# Patient Record
Sex: Female | Born: 1960 | Race: White | Hispanic: No | Smoking: Former smoker
Health system: Southern US, Community
[De-identification: ages and names within clinical notes are randomized; demographics above are authoritative.]

## PROBLEM LIST (undated history)

## (undated) DIAGNOSIS — M51369 Other intervertebral disc degeneration, lumbar region without mention of lumbar back pain or lower extremity pain: Secondary | ICD-10-CM

## (undated) DIAGNOSIS — E785 Hyperlipidemia, unspecified: Secondary | ICD-10-CM

## (undated) DIAGNOSIS — F329 Major depressive disorder, single episode, unspecified: Secondary | ICD-10-CM

## (undated) DIAGNOSIS — R011 Cardiac murmur, unspecified: Secondary | ICD-10-CM

## (undated) DIAGNOSIS — F32A Depression, unspecified: Secondary | ICD-10-CM

## (undated) DIAGNOSIS — M5136 Other intervertebral disc degeneration, lumbar region: Secondary | ICD-10-CM

## (undated) DIAGNOSIS — J4530 Mild persistent asthma, uncomplicated: Secondary | ICD-10-CM

## (undated) DIAGNOSIS — F909 Attention-deficit hyperactivity disorder, unspecified type: Secondary | ICD-10-CM

## (undated) DIAGNOSIS — E118 Type 2 diabetes mellitus with unspecified complications: Secondary | ICD-10-CM

## (undated) DIAGNOSIS — K219 Gastro-esophageal reflux disease without esophagitis: Secondary | ICD-10-CM

## (undated) DIAGNOSIS — K589 Irritable bowel syndrome without diarrhea: Secondary | ICD-10-CM

## (undated) HISTORY — DX: Cardiac murmur, unspecified: R01.1

## (undated) HISTORY — DX: Hyperlipidemia, unspecified: E78.5

## (undated) HISTORY — PX: BLADDER SURGERY: SHX569

## (undated) HISTORY — DX: Depression, unspecified: F32.A

## (undated) HISTORY — PX: CATARACT EXTRACTION, BILATERAL: SHX1313

## (undated) HISTORY — DX: Irritable bowel syndrome, unspecified: K58.9

## (undated) HISTORY — DX: Mild persistent asthma, uncomplicated: J45.30

## (undated) HISTORY — DX: Attention-deficit hyperactivity disorder, unspecified type: F90.9

## (undated) HISTORY — PX: CHOLECYSTECTOMY, LAPAROSCOPIC: SHX56

## (undated) HISTORY — PX: PARTIAL HYSTERECTOMY: SHX80

## (undated) HISTORY — DX: Type 2 diabetes mellitus with unspecified complications: E11.8

## (undated) HISTORY — DX: Other intervertebral disc degeneration, lumbar region without mention of lumbar back pain or lower extremity pain: M51.369

## (undated) HISTORY — DX: Gastro-esophageal reflux disease without esophagitis: K21.9

## (undated) HISTORY — PX: NECK SURGERY: SHX720

## (undated) HISTORY — PX: TONSILLECTOMY: SHX5217

## (undated) HISTORY — DX: Other intervertebral disc degeneration, lumbar region: M51.36

---
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
## 2006-08-22 DIAGNOSIS — J45909 Unspecified asthma, uncomplicated: Secondary | ICD-10-CM | POA: Insufficient documentation

## 2006-08-22 HISTORY — DX: Unspecified asthma, uncomplicated: J45.909

## 2008-01-02 DIAGNOSIS — J301 Allergic rhinitis due to pollen: Secondary | ICD-10-CM | POA: Insufficient documentation

## 2008-01-02 HISTORY — DX: Allergic rhinitis due to pollen: J30.1

## 2010-01-12 DIAGNOSIS — G4700 Insomnia, unspecified: Secondary | ICD-10-CM

## 2010-01-12 HISTORY — DX: Insomnia, unspecified: G47.00

## 2010-11-04 DIAGNOSIS — F4321 Adjustment disorder with depressed mood: Secondary | ICD-10-CM

## 2010-11-04 HISTORY — DX: Adjustment disorder with depressed mood: F43.21

## 2012-06-11 DIAGNOSIS — E66813 Obesity, class 3: Secondary | ICD-10-CM

## 2012-06-11 HISTORY — DX: Morbid (severe) obesity due to excess calories: E66.01

## 2012-06-11 HISTORY — DX: Obesity, class 3: E66.813

## 2012-07-10 DIAGNOSIS — K911 Postgastric surgery syndromes: Secondary | ICD-10-CM

## 2012-07-10 HISTORY — DX: Postgastric surgery syndromes: K91.1

## 2013-07-03 DIAGNOSIS — R7303 Prediabetes: Secondary | ICD-10-CM

## 2013-07-03 HISTORY — DX: Prediabetes: R73.03

## 2019-08-13 DIAGNOSIS — Z01818 Encounter for other preprocedural examination: Secondary | ICD-10-CM

## 2019-12-27 DIAGNOSIS — I350 Nonrheumatic aortic (valve) stenosis: Secondary | ICD-10-CM

## 2020-01-14 ENCOUNTER — Ambulatory Visit (INDEPENDENT_AMBULATORY_CARE_PROVIDER_SITE_OTHER): Payer: Managed Care, Other (non HMO) | Admitting: Cardiology

## 2020-01-14 ENCOUNTER — Encounter: Payer: Self-pay | Admitting: Cardiology

## 2020-01-14 ENCOUNTER — Other Ambulatory Visit: Payer: Self-pay

## 2020-01-14 VITALS — BP 110/80 | HR 82 | Ht 62.0 in | Wt 199.0 lb

## 2020-01-14 DIAGNOSIS — R600 Localized edema: Secondary | ICD-10-CM

## 2020-01-14 DIAGNOSIS — E118 Type 2 diabetes mellitus with unspecified complications: Secondary | ICD-10-CM

## 2020-01-14 DIAGNOSIS — Z8249 Family history of ischemic heart disease and other diseases of the circulatory system: Secondary | ICD-10-CM

## 2020-01-14 DIAGNOSIS — R0600 Dyspnea, unspecified: Secondary | ICD-10-CM

## 2020-01-14 DIAGNOSIS — F32A Depression, unspecified: Secondary | ICD-10-CM | POA: Insufficient documentation

## 2020-01-14 DIAGNOSIS — I10 Essential (primary) hypertension: Secondary | ICD-10-CM | POA: Insufficient documentation

## 2020-01-14 DIAGNOSIS — K219 Gastro-esophageal reflux disease without esophagitis: Secondary | ICD-10-CM | POA: Insufficient documentation

## 2020-01-14 DIAGNOSIS — F329 Major depressive disorder, single episode, unspecified: Secondary | ICD-10-CM | POA: Insufficient documentation

## 2020-01-14 DIAGNOSIS — E785 Hyperlipidemia, unspecified: Secondary | ICD-10-CM

## 2020-01-14 DIAGNOSIS — K589 Irritable bowel syndrome without diarrhea: Secondary | ICD-10-CM | POA: Insufficient documentation

## 2020-01-14 DIAGNOSIS — J4530 Mild persistent asthma, uncomplicated: Secondary | ICD-10-CM | POA: Insufficient documentation

## 2020-01-14 DIAGNOSIS — R0609 Other forms of dyspnea: Secondary | ICD-10-CM | POA: Insufficient documentation

## 2020-01-14 DIAGNOSIS — F909 Attention-deficit hyperactivity disorder, unspecified type: Secondary | ICD-10-CM | POA: Insufficient documentation

## 2020-01-14 DIAGNOSIS — G43909 Migraine, unspecified, not intractable, without status migrainosus: Secondary | ICD-10-CM

## 2020-01-14 DIAGNOSIS — E669 Obesity, unspecified: Secondary | ICD-10-CM

## 2020-01-14 DIAGNOSIS — R011 Cardiac murmur, unspecified: Secondary | ICD-10-CM | POA: Insufficient documentation

## 2020-01-14 DIAGNOSIS — M5136 Other intervertebral disc degeneration, lumbar region: Secondary | ICD-10-CM | POA: Insufficient documentation

## 2020-01-14 HISTORY — DX: Migraine, unspecified, not intractable, without status migrainosus: G43.909

## 2020-01-14 HISTORY — DX: Essential (primary) hypertension: I10

## 2020-01-14 MED ORDER — POTASSIUM CHLORIDE CRYS ER 20 MEQ PO TBCR: 20.0000 meq | EXTENDED_RELEASE_TABLET | Freq: Every day | ORAL | 2 refills | Status: AC

## 2020-01-14 MED ORDER — FUROSEMIDE 40 MG PO TABS: 40.0000 mg | ORAL_TABLET | Freq: Every day | ORAL | 2 refills | Status: AC

## 2020-01-14 NOTE — Patient Instructions (Signed)
Medication Instructions:  Your physician has recommended you make the following change in your medication:  INCREASE: Lasix 40 mg daily   START: Potassium 20 meq daily   *If you need a refill on your cardiac medications before your next appointment, please call your pharmacy*   Lab Work: Your physician recommends that you return for lab work within 3-7 days of scheduled ct: bmp    If you have labs (blood work) drawn today and your tests are completely normal, you will receive your results only by: Marland Kitchen MyChart Message (if you have MyChart) OR . A paper copy in the mail If you have any lab test that is abnormal or we need to change your treatment, we will call you to review the results.   Testing/Procedures: Your cardiac CT will be scheduled at one of the below locations:   [facility] [address] [place], [place] [address] [phone]  OR  [facility] [address] [place], [place] [address] [phone]  If scheduled at [facility], please arrive at the [facility] main entrance of [facility] 30 minutes prior to test start time. Proceed to the [facility] Radiology Department (first floor) to check-in and test prep.  If scheduled at [facility], please arrive 15 mins early for check-in and test prep.  Please follow these instructions carefully (unless otherwise directed):    On the Night Before the Test: . Be sure to Drink plenty of water. . Do not consume any caffeinated/decaffeinated beverages or chocolate 12 hours prior to your test. . Do not take any antihistamines 12 hours prior to your test. . If you take Metformin do not take 24 hours prior to test.   On the Day of the Test: . Drink plenty of water. Do not drink any water within one hour of the test. . Do not eat any food 4 hours prior to the test. . You may take your regular medications prior to the  test.  . Take metoprolol (Lopressor) two hours prior to test. . HOLD Furosemide and Hydrochlorothiazide morning of the test. . FEMALES- please wear underwire-free bra if available   *For Clinical Staff only. Please instruct patient the following:*        -Drink plenty of water       -Hold Furosemide and hydrochlorothiazide morning of the test       -Take metoprolol (Lopressor) 2 hours prior to test (if applicable).                        After the Test: . Drink plenty of water. . After receiving IV contrast, you may experience a mild flushed feeling. This is normal. . On occasion, you may experience a mild rash up to 24 hours after the test. This is not dangerous. If this occurs, you can take Benadryl 25 mg and increase your fluid intake. . If you experience trouble breathing, this can be serious. If it is severe call 911 IMMEDIATELY. If it is mild, please call our office. . If you take any of these medications: Glipizide/Metformin, Avandament, Glucavance, please do not take 48 hours after completing test unless otherwise instructed.   Once we have confirmed authorization from your insurance company, we will call you to set up a date and time for your test.   For non-scheduling related questions, please contact the cardiac imaging nurse navigator should you have any questions/concerns: Huntley Dec  Earlene Plater, RN Navigator Cardiac Imaging [facility] [phone] office  For scheduling needs, including cancellations and rescheduling, please call [phone].      Follow-Up: At [facility], you and your health needs are our priority.  As part of our continuing mission to provide you with exceptional heart care, we have created designated Provider Care Teams.  These Care Teams include your primary Cardiologist (physician) and Advanced Practice Providers (APPs -  Physician Assistants and Nurse Practitioners) who all work together to provide you with the care you  need, when you need it.  We recommend signing up for the patient portal called "MyChart".  Sign up information is provided on this After Visit Summary.  MyChart is used to connect with patients for Virtual Visits (Telemedicine).  Patients are able to view lab/test results, encounter notes, upcoming appointments, etc.  Non-urgent messages can be sent to your provider as well.   To learn more about what you can do with MyChart, go to [email or web address].    Your next appointment:   1 month(s)  The format for your next appointment:   In Person  Provider:   Thomasene Ripple, DO   Other Instructions   Cardiac CT Angiogram A cardiac CT angiogram is a procedure to look at the heart and the area around the heart. It may be done to help find the cause of chest pains or other symptoms of heart disease. During this procedure, a substance called contrast dye is injected into the blood vessels in the area to be checked. A large X-ray machine, called a CT scanner, then takes detailed pictures of the heart and the surrounding area. The procedure is also sometimes called a coronary CT angiogram, coronary artery scanning, or CTA. A cardiac CT angiogram allows the health care provider to see how well blood is flowing to and from the heart. The health care provider will be able to see if there are any problems, such as:  Blockage or narrowing of the coronary arteries in the heart.  Fluid around the heart.  Signs of weakness or disease in the muscles, valves, and tissues of the heart. Tell a health care provider about:  Any allergies you have. This is especially important if you have had a previous allergic reaction to contrast dye.  All medicines you are taking, including vitamins, herbs, eye drops, creams, and over-the-counter medicines.  Any blood disorders you have.  Any surgeries you have had.  Any medical conditions you have.  Whether you are pregnant or may be pregnant.  Any anxiety  disorders, chronic pain, or other conditions you have that may increase your stress or prevent you from lying still. What are the risks? Generally, this is a safe procedure. However, problems may occur, including:  Bleeding.  Infection.  Allergic reactions to medicines or dyes.  Damage to other structures or organs.  Kidney damage from the contrast dye that is used.  Increased risk of cancer from radiation exposure. This risk is low. Talk with your health care provider about: ? The risks and benefits of testing. ? How you can receive the lowest dose of radiation. What happens before the procedure?  Wear comfortable clothing and remove any jewelry, glasses, dentures, and hearing aids.  Follow instructions from your health care provider about eating and drinking. This may include: ? For 12 hours before the procedure -- avoid caffeine. This includes tea, coffee, soda, energy drinks, and diet pills. Drink plenty of water or other fluids that do  not have caffeine in them. Being well hydrated can prevent complications. ? For 4-6 hours before the procedure -- stop eating and drinking. The contrast dye can cause nausea, but this is less likely if your stomach is empty.  Ask your health care provider about changing or stopping your regular medicines. This is especially important if you are taking diabetes medicines, blood thinners, or medicines to treat problems with erections (erectile dysfunction). What happens during the procedure?   Hair on your chest may need to be removed so that small sticky patches called electrodes can be placed on your chest. These will transmit information that helps to monitor your heart during the procedure.  An IV will be inserted into one of your veins.  You might be given a medicine to control your heart rate during the procedure. This will help to ensure that good images are obtained.  You will be asked to lie on an exam table. This table will slide in and  out of the CT machine during the procedure.  Contrast dye will be injected into the IV. You might feel warm, or you may get a metallic taste in your mouth.  You will be given a medicine called nitroglycerin. This will relax or dilate the arteries in your heart.  The table that you are lying on will move into the CT machine tunnel for the scan.  The person running the machine will give you instructions while the scans are being done. You may be asked to: ? Keep your arms above your head. ? Hold your breath. ? Stay very still, even if the table is moving.  When the scanning is complete, you will be moved out of the machine.  The IV will be removed. The procedure may vary among health care providers and hospitals. What can I expect after the procedure? After your procedure, it is common to have:  A metallic taste in your mouth from the contrast dye.  A feeling of warmth.  A headache from the nitroglycerin. Follow these instructions at home:  Take over-the-counter and prescription medicines only as told by your health care provider.  If you are told, drink enough fluid to keep your urine pale yellow. This will help to flush the contrast dye out of your body.  Most people can return to their normal activities right after the procedure. Ask your health care provider what activities are safe for you.  It is up to you to get the results of your procedure. Ask your health care provider, or the department that is doing the procedure, when your results will be ready.  Keep all follow-up visits as told by your health care provider. This is important. Contact a health care provider if:  You have any symptoms of allergy to the contrast dye. These include: ? Shortness of breath. ? Rash or hives. ? A racing heartbeat. Summary  A cardiac CT angiogram is a procedure to look at the heart and the area around the heart. It may be done to help find the cause of chest pains or other symptoms of  heart disease.  During this procedure, a large X-ray machine, called a CT scanner, takes detailed pictures of the heart and the surrounding area after a contrast dye has been injected into blood vessels in the area.  Ask your health care provider about changing or stopping your regular medicines before the procedure. This is especially important if you are taking diabetes medicines, blood thinners, or medicines to treat erectile  dysfunction.  If you are told, drink enough fluid to keep your urine pale yellow. This will help to flush the contrast dye out of your body. This information is not intended to replace advice given to you by your health care provider. Make sure you discuss any questions you have with your health care provider. Document Revised: 05/01/2019 Document Reviewed: 05/01/2019 Elsevier Patient Education  [place].

## 2020-01-14 NOTE — Progress Notes (Signed)
Cardiology Office Note:    Date:  01/14/2020   ID:  Shirley Nash, DOB 07-Dec-1960, MRN [id number]  PCP:  Lowella Dandy, NP  Cardiologist:  No primary care provider on file.  Electrophysiologist:  None   Referring MD: Lowella Dandy, NP   Chief Complaint  Patient presents with  . New Patient (Initial Visit)    History of Present Illness:    Shirley Nash is a 59 y.o. female with a hx of hypertension, hyperlipidemia, family history of premature coronary artery disease presents to be evaluated for shortness of breath.  The patient tells me that she did see her primary care doctor after her surgery back in January due to significant hypertension.  At that time to start amlodipine.  Shortly after starting amlodipine she experienced increased leg edema.  This medication was stopped she was put on diuretics.  Unfortunately her leg edema continued with high resolution.  She was sent for a transthoracic echocardiogram at [facility] which show mild aortic stenosis with diastolic dysfunction.  She tells me that she also is experiencing progressive shortness of breath on exertion.  She notes that there is nothing she can do without experiencing significant shortness of breath.  Notes that even climbing on the clinic bed short of breath.  She denies any chest pain.  He is concerned given her family history of coronary artery disease.  Past Medical History:  Diagnosis Date  . Adjustment disorder with depressed mood 11/04/2010  . Adult ADHD (attention deficit hyperactivity disorder)   . Allergic rhinitis due to pollen 01/02/2008  . Asthma 08/22/2006  . Degeneration, intervertebral disc, lumbar   . Depression   . 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 Dyslipidemia   . Gastroesophageal reflux disease   . Heart murmur   . Hypertension 01/14/2020  . Insomnia 01/12/2010  . Irritable bowel syndrome   . Migraine 01/14/2020  . Mild persistent asthma   . Obesity, Class III, BMI 40-49.9 (morbid obesity) ([place])  06/11/2012  . Prediabetes 07/03/2013  . Type II diabetes mellitus with complication [facility])     Past Surgical History:  Procedure Laterality Date  . BLADDER SURGERY    . CATARACT EXTRACTION, BILATERAL Bilateral   . CHOLECYSTECTOMY, LAPAROSCOPIC    . NECK SURGERY    . PARTIAL HYSTERECTOMY    . TONSILLECTOMY      Current Medications: Current Meds  Medication Sig  . amphetamine-dextroamphetamine (ADDERALL XR) 30 MG 24 hr capsule Take by mouth.  . Aspirin Buf,CaCarb-MgCarb-MgO, 81 MG TABS Take by mouth.  Marland Kitchen atorvastatin (LIPITOR) 20 MG tablet Take 20 mg by mouth daily.  . Azelastine-Fluticasone (DYMISTA) 137-50 MCG/ACT SUSP Inhale into the lungs.  . benazepril (LOTENSIN) 40 MG tablet Take 40 mg by mouth daily.  . Biotin 1000 MCG tablet Take by mouth.  Marland Kitchen buPROPion (WELLBUTRIN XL) 150 MG 24 hr tablet Take by mouth.  . cyclobenzaprine (FLEXERIL) 5 MG tablet Take by mouth.  . ergocalciferol (VITAMIN D2) 1.25 MG (50000 UT) capsule Take by mouth.  . Fluticasone-Salmeterol (ADVAIR) 100-50 MCG/DOSE AEPB Inhale 1 puff into the lungs 2 (two) times daily.  . furosemide (LASIX) 20 MG tablet Take by mouth.  . hydrochlorothiazide (HYDRODIURIL) 12.5 MG tablet Take by mouth.  . meclizine (ANTIVERT) 12.5 MG tablet Take 12.5 mg by mouth 3 (three) times daily as needed.  . metFORMIN (GLUCOPHAGE) 1000 MG tablet Take 1,000 mg by mouth daily.  . metoprolol succinate (TOPROL-XL) 50 MG 24 hr tablet Take 50 mg by mouth daily.  Marland Kitchen  montelukast (SINGULAIR) 10 MG tablet Take 10 mg by mouth daily.     Allergies:   Patient has no allergy information on record.   Social History   Socioeconomic History  . Marital status: Unknown    Spouse name: Not on file  . Number of children: Not on file  . Years of education: Not on file  . Highest education level: Not on file  Occupational History  . Not on file  Tobacco Use  . Smoking status: Former Research scientist (life sciences)  . Smokeless tobacco: Never Used  Substance and Sexual  Activity  . Alcohol use: Yes  . Drug use: Never  . Sexual activity: Not on file  Other Topics Concern  . Not on file  Social History Narrative  . Not on file   Social Determinants of Health   Financial Resource Strain:   . Difficulty of Paying Living Expenses:   Food Insecurity:   . Worried About Charity fundraiser in the Last Year:   . Arboriculturist in the Last Year:   Transportation Needs:   . Film/video editor (Medical):   Marland Kitchen Lack of Transportation (Non-Medical):   Physical Activity:   . Days of Exercise per Week:   . Minutes of Exercise per Session:   Stress:   . Feeling of Stress :   Social Connections:   . Frequency of Communication with Friends and Family:   . Frequency of Social Gatherings with Friends and Family:   . Attends Religious Services:   . Active Member of Clubs or Organizations:   . Attends Archivist Meetings:   Marland Kitchen Marital Status:      Family History: The patient's family history includes Asthma in her mother and sister; Diabetes in her father and paternal grandmother; Heart disease in her father; Hypertension in her father, maternal grandmother, paternal grandmother, and sister; Prostate cancer in her father; Skin cancer in her father; Stroke in her maternal grandmother and mother.  ROS:   Review of Systems  Constitution: Negative for decreased appetite, fever and weight gain.  HENT: Negative for congestion, ear discharge, hoarse voice and sore throat.   Eyes: Negative for discharge, redness, vision loss in right eye and visual halos.  Cardiovascular: Reports dyspnea on exertion bilateral leg edema.  Negative for chest pain, orthopnea and palpitations.  Respiratory: Negative for cough, hemoptysis, shortness of breath and snoring.   Endocrine: Negative for heat intolerance and polyphagia.  Hematologic/Lymphatic: Negative for bleeding problem. Does not bruise/bleed easily.  Skin: Negative for flushing, nail changes, rash and suspicious  lesions.  Musculoskeletal: Negative for arthritis, joint pain, muscle cramps, myalgias, neck pain and stiffness.  Gastrointestinal: Negative for abdominal pain, bowel incontinence, diarrhea and excessive appetite.  Genitourinary: Negative for decreased libido, genital sores and incomplete emptying.  Neurological: Negative for brief paralysis, focal weakness, headaches and loss of balance.  Psychiatric/Behavioral: Negative for altered mental status, depression and suicidal ideas.  Allergic/Immunologic: Negative for HIV exposure and persistent infections.    EKGs/Labs/Other Studies Reviewed:    The following studies were reviewed today:   EKG:  The ekg ordered today demonstrates sinus rhythm, heart rate 82 bpm with electrical conduction suggesting left atrial enlargement.  No prior EKG for comparison.  Transthoracic echocardiogram done on December 27, 2019 at [facility] shows normal LVEF 55 to 60%.  Diastolic filling pattern suggest impaired relaxation.  No wall motion abnormalities.  Right ventricle normal in size.  Left atrium is mildly dilated.  Mild aortic  stenosis: Mean gradient 12 mmHg, valve area by continuity equation 1.72 cm.  Recent Labs: Chemistry: Linton Rump 122, BUN 20, creatinine 0.91, sodium 140, potassium 3.3, chloride 97, bicarb 29, calcium 10.1, total protein 7.2, albumin 4.5, total globulin 2.7, total bili 0.6, alk phos 106, AST 24, ALT 31 Recent Lipid Panel No results found for: CHOL, TRIG, HDL, CHOLHDL, VLDL, LDLCALC, LDLDIRECT  Physical Exam:    VS:  BP 110/80   Pulse 82   Ht 5' 2"  (1.575 m)   Wt 199 lb (90.3 kg)   SpO2 100%   BMI 36.40 kg/m     Wt Readings from Last 3 Encounters:  01/14/20 199 lb (90.3 kg)     GEN: Well nourished, well developed in no acute distress HEENT: Normal NECK: No JVD; No carotid bruits LYMPHATICS: No lymphadenopathy CARDIAC: S1S2 noted,RRR, no murmurs, rubs, gallops RESPIRATORY:  Clear to auscultation without rales,  wheezing or rhonchi  ABDOMEN: Soft, non-tender, non-distended, +bowel sounds, no guarding. EXTREMITIES: No edema, No cyanosis, no clubbing MUSCULOSKELETAL:  No deformity  SKIN: Warm and dry NEUROLOGIC:  Alert and oriented x 3, non-focal PSYCHIATRIC:  Normal affect, good insight  ASSESSMENT:    1. Dyspnea on exertion   2. Type II diabetes mellitus with complication (HCC)   3. Dyslipidemia   4. Bilateral leg edema   5. Family history of premature CAD   6. Obesity (BMI 30-39.9)    PLAN:     1.  Her dyspnea on exertion is concerning for angina equivalent.  Therefore I am going to pursue ischemic evaluation in this patient given her significant risk factors which includes diabetes, premature family history of coronary artery disease, dyslipidemia and hypertension.  I have educated patient about this testing she has no IV contrast allergy she is willing to proceed with this testing.  2.  Bilateral leg edema there is evidence of +1 bilateral lower extremity edema.  I do think that venous insufficiency may be playing a role here.  But for now I am going to increase her Lasix to 40 mg daily and add potassium supplement of 20 mEq daily.  But the educated patient she is willing to do this.  I also asked her to use support stockings daily.  3.  Type 2 diabetes-continue her medication per her PCP.  4.  Hyperlipidemia to the patient on her atorvastatin 20 mg dosing.  5.  Hypertension-continue her current medication regimen to keep her target blood pressure less than 130/80.  6.  Obesity-the patient understands the need to lose weight with diet and exercise. We have discussed specific strategies for this.  7.  I reviewed her blood work from her PCP which was done on January 10, 2020 there is evidence of hypokalemia but this cannot be repleted with 20 mEq daily of potassium.  8.  Also reviewed her echocardiogram which was done at [facility] and also discussed with the patient.  Her questions  were answered.   The patient is in agreement with the above plan. The patient left the office in stable condition.  The patient will follow up in 1 month or sooner if needed due to medication increase   Medication Adjustments/Labs and Tests Ordered: Current medicines are reviewed at length with the patient today.  Concerns regarding medicines are outlined above.  No orders of the defined types were placed in this encounter.  No orders of the defined types were placed in this encounter.   There are no Patient Instructions on file for  this visit.   Adopting a Healthy Lifestyle.  Know what a healthy weight is for you (roughly BMI <25) and aim to maintain this   Aim for 7+ servings of fruits and vegetables daily   65-80+ fluid ounces of water or unsweet tea for healthy kidneys   Limit to max 1 drink of alcohol per day; avoid smoking/tobacco   Limit animal fats in diet for cholesterol and heart health - choose grass fed whenever available   Avoid highly processed foods, and foods high in saturated/trans fats   Aim for low stress - take time to unwind and care for your mental health   Aim for 150 min of moderate intensity exercise weekly for heart health, and weights twice weekly for bone health   Aim for 7-9 hours of sleep daily   When it comes to diets, agreement about the perfect plan isnt easy to find, even among the experts. Experts at the [place] developed an idea known as the Healthy Eating Plate. Just imagine a plate divided into logical, healthy portions.   The emphasis is on diet quality:   Load up on vegetables and fruits - one-half of your plate: Aim for color and variety, and remember that potatoes dont count.   Go for whole grains - one-quarter of your plate: Whole wheat, barley, wheat berries, quinoa, oats, brown rice, and foods made with them. If you want pasta, go with whole wheat pasta.   Protein power - one-quarter of your plate:  Fish, chicken, beans, and nuts are all healthy, versatile protein sources. Limit red meat.   The diet, however, does go beyond the plate, offering a few other suggestions.   Use healthy plant oils, such as olive, canola, soy, corn, sunflower and peanut. Check the labels, and avoid partially hydrogenated oil, which have unhealthy trans fats.   If youre thirsty, drink water. Coffee and tea are good in moderation, but skip sugary drinks and limit milk and dairy products to one or two daily servings.   The type of carbohydrate in the diet is more important than the amount. Some sources of carbohydrates, such as vegetables, fruits, whole grains, and beans-are healthier than others.   Finally, stay active  Signed, Berniece Salines, DO  01/14/2020 3:19 PM    [facility]

## 2020-01-19 DIAGNOSIS — E785 Hyperlipidemia, unspecified: Secondary | ICD-10-CM | POA: Diagnosis not present

## 2020-01-19 DIAGNOSIS — E1169 Type 2 diabetes mellitus with other specified complication: Secondary | ICD-10-CM | POA: Diagnosis not present

## 2020-01-19 DIAGNOSIS — R079 Chest pain, unspecified: Secondary | ICD-10-CM | POA: Diagnosis not present

## 2020-01-19 DIAGNOSIS — I10 Essential (primary) hypertension: Secondary | ICD-10-CM | POA: Diagnosis not present

## 2020-01-19 DIAGNOSIS — R0602 Shortness of breath: Secondary | ICD-10-CM | POA: Diagnosis not present

## 2020-01-20 DIAGNOSIS — I10 Essential (primary) hypertension: Secondary | ICD-10-CM | POA: Diagnosis not present

## 2020-01-20 DIAGNOSIS — R0602 Shortness of breath: Secondary | ICD-10-CM | POA: Diagnosis not present

## 2020-01-20 DIAGNOSIS — E1169 Type 2 diabetes mellitus with other specified complication: Secondary | ICD-10-CM | POA: Diagnosis not present

## 2020-01-20 DIAGNOSIS — E785 Hyperlipidemia, unspecified: Secondary | ICD-10-CM | POA: Diagnosis not present

## 2020-01-20 DIAGNOSIS — R079 Chest pain, unspecified: Secondary | ICD-10-CM | POA: Diagnosis not present

## 2020-01-31 LAB — BASIC METABOLIC PANEL
BUN/Creatinine Ratio: 19 (ref 9–23)
BUN: 18 mg/dL (ref 6–24)
CO2: 26 mmol/L (ref 20–29)
Calcium: 10.6 mg/dL — ABNORMAL HIGH (ref 8.7–10.2)
Chloride: 97 mmol/L (ref 96–106)
Creatinine, Ser: 0.97 mg/dL (ref 0.57–1.00)
GFR calc Af Amer: 76 mL/min/{1.73_m2} (ref >59)
GFR calc non Af Amer: 65 mL/min/{1.73_m2} (ref >59)
Glucose: 62 mg/dL — ABNORMAL LOW (ref 65–99)
Potassium: 3.9 mmol/L (ref 3.5–5.2)
Sodium: 138 mmol/L (ref 134–144)

## 2020-02-03 ENCOUNTER — Telehealth: Payer: Self-pay

## 2020-02-03 ENCOUNTER — Telehealth (HOSPITAL_COMMUNITY): Payer: Self-pay | Admitting: *Deleted

## 2020-02-03 NOTE — Telephone Encounter (Signed)
Spoke with patient regarding results and recommendation.  Patient verbalizes understanding and is agreeable to plan of care. Advised patient to call back with any issues or concerns.  

## 2020-02-03 NOTE — Telephone Encounter (Signed)
-----   Message from Thomasene Ripple, DO sent at 01/31/2020  6:25 PM EDT ----- Blood glucose on the lower side. Her calcium is 10.6 likely dehydration

## 2020-02-03 NOTE — Telephone Encounter (Signed)

## 2020-02-04 ENCOUNTER — Ambulatory Visit (HOSPITAL_COMMUNITY)
Admission: RE | Admit: 2020-02-04 | Discharge: 2020-02-04 | Disposition: A | Discharge status: Home / Self Care | Payer: Managed Care, Other (non HMO) | Source: Ambulatory Visit | Attending: Cardiology | Admitting: Cardiology

## 2020-02-04 ENCOUNTER — Encounter (HOSPITAL_COMMUNITY): Payer: Self-pay

## 2020-02-04 ENCOUNTER — Other Ambulatory Visit: Payer: Self-pay

## 2020-02-04 DIAGNOSIS — R0600 Dyspnea, unspecified: Secondary | ICD-10-CM | POA: Insufficient documentation

## 2020-02-04 DIAGNOSIS — E785 Hyperlipidemia, unspecified: Secondary | ICD-10-CM | POA: Insufficient documentation

## 2020-02-04 MED ORDER — IOHEXOL 350 MG/ML SOLN
80.0000 mL | Freq: Once | INTRAVENOUS | Status: AC | PRN
  Administered 2020-02-04: 80 mL via INTRAVENOUS

## 2020-02-04 MED ORDER — NITROGLYCERIN 0.4 MG SL SUBL
0.8000 mg | SUBLINGUAL_TABLET | Freq: Once | SUBLINGUAL | Status: AC
  Administered 2020-02-04: 0.8 mg via SUBLINGUAL

## 2020-02-04 MED ORDER — METOPROLOL TARTRATE 5 MG/5ML IV SOLN
5.0000 mg | Freq: Once | INTRAVENOUS | Status: AC
  Administered 2020-02-04: 5 mg via INTRAVENOUS

## 2020-02-05 ENCOUNTER — Telehealth: Payer: Self-pay

## 2020-02-05 DIAGNOSIS — E785 Hyperlipidemia, unspecified: Secondary | ICD-10-CM

## 2020-02-05 DIAGNOSIS — I10 Essential (primary) hypertension: Secondary | ICD-10-CM

## 2020-02-05 MED ORDER — ATORVASTATIN CALCIUM 40 MG PO TABS: 40.0000 mg | ORAL_TABLET | Freq: Every day | ORAL | 3 refills | Status: AC

## 2020-02-05 NOTE — Telephone Encounter (Signed)
Spoke with patient regarding results and recommendations.  Patient verbalizes understanding and is agreeable to plan of care. Advised patient to call back with any issues or concerns.   Order placed for Atorvastatin and future labs.

## 2020-02-05 NOTE — Telephone Encounter (Signed)
-----   Message from Alver Sorrow, NP sent at 02/05/2020 11:14 AM EDT ----- Cardiac CTA shows mild plaque on the heart arteries, but none causing blockages needing to be fixed. This is called 'nonobstructive coronary artery disease'. Overall good result! Plan to treat with preventative measures. Continue aspirin 81mg  daily. Recommend LDL goal of <70 (this is your "lousy" cholesterol - most recent by PCP in March of 84) - recommend increasing Atorvastatin to 40mg  daily and repeat lipid/liver in 6 weeks. She has appt 02/13/20 with Dr. and results will be discussed in depth.

## 2020-02-13 ENCOUNTER — Other Ambulatory Visit: Payer: Self-pay

## 2020-02-13 ENCOUNTER — Encounter: Payer: Self-pay | Admitting: Cardiology

## 2020-02-13 ENCOUNTER — Ambulatory Visit (INDEPENDENT_AMBULATORY_CARE_PROVIDER_SITE_OTHER): Payer: Managed Care, Other (non HMO) | Admitting: Cardiology

## 2020-02-13 VITALS — BP 124/78 | HR 87 | Ht 62.0 in | Wt 198.4 lb

## 2020-02-13 DIAGNOSIS — I10 Essential (primary) hypertension: Secondary | ICD-10-CM

## 2020-02-13 DIAGNOSIS — I2510 Atherosclerotic heart disease of native coronary artery without angina pectoris: Secondary | ICD-10-CM

## 2020-02-13 DIAGNOSIS — I350 Nonrheumatic aortic (valve) stenosis: Secondary | ICD-10-CM

## 2020-02-13 DIAGNOSIS — R0602 Shortness of breath: Secondary | ICD-10-CM | POA: Insufficient documentation

## 2020-02-13 DIAGNOSIS — E118 Type 2 diabetes mellitus with unspecified complications: Secondary | ICD-10-CM | POA: Diagnosis not present

## 2020-02-13 DIAGNOSIS — I739 Peripheral vascular disease, unspecified: Secondary | ICD-10-CM

## 2020-02-13 NOTE — Patient Instructions (Signed)
Medication Instructions:  Your physician recommends that you continue on your current medications as directed. Please refer to the Current Medication list given to you today.  *If you need a refill on your cardiac medications before your next appointment, please call your pharmacy*   Lab Work: NONE If you have labs (blood work) drawn today and your tests are completely normal, you will receive your results only by: Marland Kitchen MyChart Message (if you have MyChart) OR . A paper copy in the mail If you have any lab test that is abnormal or we need to change your treatment, we will call you to review the results.   Testing/Procedures: Your physician has requested that you have a lower  extremity arterial duplex. This test is an ultrasound of the arteries in the legs or arms. It looks at arterial blood flow in the legs and arms. Allow one hour for Lower and Upper Arterial scans. There are no restrictions or special instructions  Your physician has requested that you have an ankle brachial index (ABI). During this test an ultrasound and blood pressure cuff are used to evaluate the arteries that supply the arms and legs with blood. Allow thirty minutes for this exam. There are no restrictions or special instructions.   Follow-Up: At [facility], you and your health needs are our priority.  As part of our continuing mission to provide you with exceptional heart care, we have created designated Provider Care Teams.  These Care Teams include your primary Cardiologist (physician) and Advanced Practice Providers (APPs -  Physician Assistants and Nurse Practitioners) who all work together to provide you with the care you need, when you need it.  We recommend signing up for the patient portal called "MyChart".  Sign up information is provided on this After Visit Summary.  MyChart is used to connect with patients for Virtual Visits (Telemedicine).  Patients are able to view lab/test results, encounter notes,  upcoming appointments, etc.  Non-urgent messages can be sent to your provider as well.   To learn more about what you can do with MyChart, go to [email or web address].    Your next appointment:   6 month(s)  The format for your next appointment:   In Person  Provider:   Thomasene Ripple, DO

## 2020-02-13 NOTE — Progress Notes (Signed)
Cardiology Office Note:    Date:  02/13/2020   ID:  Shirley Nash, DOB 12-19-1960, MRN [id number]  PCP:  Lowella Dandy, NP  Cardiologist:  Berniece Salines, DO  Electrophysiologist:  None   Referring MD: Lowella Dandy, NP   Chief Complaint  Patient presents with  . Follow-up    1 MO FU   " I am still short of breath"  History of Present Illness:    Shirley Nash is a 59 y.o. female with a hx of hypertension, hyperlipidemia, CAD presents today for a follow up visit. She is still short of breath she tells me.   Past Medical History:  Diagnosis Date  . Adjustment disorder with depressed mood 11/04/2010  . Adult ADHD (attention deficit hyperactivity disorder)   . Allergic rhinitis due to pollen 01/02/2008  . Asthma 08/22/2006  . Degeneration, intervertebral disc, lumbar   . Depression   . 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 Dyslipidemia   . Gastroesophageal reflux disease   . Heart murmur   . Hypertension 01/14/2020  . Insomnia 01/12/2010  . Irritable bowel syndrome   . Migraine 01/14/2020  . Mild persistent asthma   . Obesity, Class III, BMI 40-49.9 (morbid obesity) ([place]) 06/11/2012  . Prediabetes 07/03/2013  . Type II diabetes mellitus with complication [facility])     Past Surgical History:  Procedure Laterality Date  . BLADDER SURGERY    . CATARACT EXTRACTION, BILATERAL Bilateral   . CHOLECYSTECTOMY, LAPAROSCOPIC    . NECK SURGERY    . PARTIAL HYSTERECTOMY    . TONSILLECTOMY      Current Medications: Current Meds  Medication Sig  . amphetamine-dextroamphetamine (ADDERALL XR) 30 MG 24 hr capsule Take 60 mg by mouth daily.   . Aspirin Buf,CaCarb-MgCarb-MgO, 81 MG TABS Take by mouth daily.   Marland Kitchen atorvastatin (LIPITOR) 40 MG tablet Take 1 tablet (40 mg total) by mouth daily.  . Azelastine-Fluticasone (DYMISTA) 137-50 MCG/ACT SUSP Inhale into the lungs as needed.   . benazepril (LOTENSIN) 40 MG tablet Take 40 mg by mouth daily.  . Biotin 1000 MCG tablet Take 1,000 mcg by mouth daily.   Marland Kitchen  buPROPion (WELLBUTRIN XL) 150 MG 24 hr tablet Take 450 mg by mouth daily.   . ergocalciferol (VITAMIN D2) 1.25 MG (50000 UT) capsule Take 50,000 Units by mouth once a week.   . furosemide (LASIX) 40 MG tablet Take 1 tablet (40 mg total) by mouth daily.  . hydrochlorothiazide (HYDRODIURIL) 12.5 MG tablet Take 12.5 mg by mouth daily.   . meclizine (ANTIVERT) 12.5 MG tablet Take 12.5 mg by mouth 3 (three) times daily as needed.  . metFORMIN (GLUCOPHAGE) 1000 MG tablet Take 1,000 mg by mouth daily.  . metoprolol succinate (TOPROL-XL) 50 MG 24 hr tablet Take 50 mg by mouth daily.  . montelukast (SINGULAIR) 10 MG tablet Take 10 mg by mouth daily.  . potassium chloride SA (KLOR-CON) 20 MEQ tablet Take 1 tablet (20 mEq total) by mouth daily.     Allergies:   Patient has no known allergies.   Social History   Socioeconomic History  . Marital status: Unknown    Spouse name: Not on file  . Number of children: Not on file  . Years of education: Not on file  . Highest education level: Not on file  Occupational History  . Not on file  Tobacco Use  . Smoking status: Former Smoker    Quit date: 2011    Years since quitting: 10.4  .  Smokeless tobacco: Never Used  Substance and Sexual Activity  . Alcohol use: Yes  . Drug use: Never  . Sexual activity: Not on file  Other Topics Concern  . Not on file  Social History Narrative  . Not on file   Social Determinants of Health   Financial Resource Strain:   . Difficulty of Paying Living Expenses:   Food Insecurity:   . Worried About Programme researcher, broadcasting/film/video in the Last Year:   . Barista in the Last Year:   Transportation Needs:   . Freight forwarder (Medical):   Marland Kitchen Lack of Transportation (Non-Medical):   Physical Activity:   . Days of Exercise per Week:   . Minutes of Exercise per Session:   Stress:   . Feeling of Stress :   Social Connections:   . Frequency of Communication with Friends and Family:   . Frequency of Social  Gatherings with Friends and Family:   . Attends Religious Services:   . Active Member of Clubs or Organizations:   . Attends Banker Meetings:   Marland Kitchen Marital Status:      Family History: The patient's family history includes Asthma in her mother and sister; Diabetes in her father and paternal grandmother; Heart disease in her father; Hypertension in her father, maternal grandmother, paternal grandmother, and sister; Prostate cancer in her father; Skin cancer in her father; Stroke in her maternal grandmother and mother.  ROS:   Review of Systems  Constitution: Negative for decreased appetite, fever and weight gain.  HENT: Negative for congestion, ear discharge, hoarse voice and sore throat.   Eyes: Negative for discharge, redness, vision loss in right eye and visual halos.  Cardiovascular: Negative for chest pain, dyspnea on exertion, leg swelling, orthopnea and palpitations.  Respiratory: Negative for cough, hemoptysis, shortness of breath and snoring.   Endocrine: Negative for heat intolerance and polyphagia.  Hematologic/Lymphatic: Negative for bleeding problem. Does not bruise/bleed easily.  Skin: Negative for flushing, nail changes, rash and suspicious lesions.  Musculoskeletal: Negative for arthritis, joint pain, muscle cramps, myalgias, neck pain and stiffness.  Gastrointestinal: Negative for abdominal pain, bowel incontinence, diarrhea and excessive appetite.  Genitourinary: Negative for decreased libido, genital sores and incomplete emptying.  Neurological: Negative for brief paralysis, focal weakness, headaches and loss of balance.  Psychiatric/Behavioral: Negative for altered mental status, depression and suicidal ideas.  Allergic/Immunologic: Negative for HIV exposure and persistent infections.    EKGs/Labs/Other Studies Reviewed:    The following studies were reviewed today:   EKG:  The ekg ordered today demonstrates   Coronary CTA Calcium score: Mild 3  vessel coronary calcium noted  Coronary Arteries: Right dominant with no anomalies  LM: Normal  LAD: 1-24% calcific stenosis in mid vessel  D1: Normal  D2: Normal  Circumflex: 1-24% calcific plaque in proximal vessel  OM1: Normal  OM2: Normal  RCA: 1-24% calcific plaque in mid vessel  PDA: Normal  PLA: Normal  IMPRESSION: 1. Calcium score 16 this is 83 rd percentile for age and sex  2.  Normal aortic root diameter 3.2 cm  3.  CAD RADS 1 non obstructive CAD see description above    Transthoracic echocardiogram done on December 27, 2019 at [facility] shows normal LVEF 55 to 60%.  Diastolic filling pattern suggest impaired relaxation.  No wall motion abnormalities.  Right ventricle normal in size.  Left atrium is mildly dilated.  Mild aortic stenosis: Mean gradient 12 mmHg, valve area by  continuity equation 1.72 cm  Recent Labs: 01/31/2020: BUN 18; Creatinine, Ser 0.97; Potassium 3.9; Sodium 138  Recent Lipid Panel No results found for: CHOL, TRIG, HDL, CHOLHDL, VLDL, LDLCALC, LDLDIRECT  Physical Exam:    VS:  BP 124/78   Pulse 87   Ht 5\' 2"  (1.575 m)   Wt 198 lb 6.4 oz (90 kg)   SpO2 99%   BMI 36.29 kg/m     Wt Readings from Last 3 Encounters:  02/13/20 198 lb 6.4 oz (90 kg)  01/14/20 199 lb (90.3 kg)     GEN: Well nourished, well developed in no acute distress HEENT: Normal NECK: No JVD; No carotid bruits LYMPHATICS: No lymphadenopathy CARDIAC: S1S2 noted,RRR, no murmurs, rubs, gallops RESPIRATORY:  Clear to auscultation without rales, wheezing or rhonchi  ABDOMEN: Soft, non-tender, non-distended, +bowel sounds, no guarding. EXTREMITIES: No edema, No cyanosis, no clubbing, bluish toes MUSCULOSKELETAL:  No deformity  SKIN: Warm and dry NEUROLOGIC:  Alert and oriented x 3, non-focal PSYCHIATRIC:  Normal affect, good insight  ASSESSMENT:    1. Shortness of breath   2. Mild CAD   3. Essential hypertension   4. Type II diabetes  mellitus with complication (HCC)   5. Mild aortic stenosis   6. Claudication of lower extremity (HCC)    PLAN:     1.  Mild CAD-we will continue patient on her aspirin 81 mg as well as hold Lipitor for now.  2.  Mild aortic stenosis aortic valve area 1.72 in April 2021.  For now there is no need to repeat an echocardiogram.  Her EF was also normal at that time  3.  Her COPD may be multifactorial which may include her lung disease as well as deconditioning.  I think she need to be reevaluated by her pulmonologist for her COPD.  She may need adjustment in her treatments.  4.  Diabetes mellitus-continue patient on her current Metformin per PCP.  5.  Hypertension her blood pressure is acceptable please continue on current antihypertensive.  6. Due to the discoloration in the toes, I like to get Ankle brachial index - she complains of leg claudication. She is a former smoker.  The patient is in agreement with the above plan. The patient left the office in stable condition.  The patient will follow up in 6 months or sooner if needed.   Medication Adjustments/Labs and Tests Ordered: Current medicines are reviewed at length with the patient today.  Concerns regarding medicines are outlined above.  Orders Placed This Encounter  Procedures  . VAS US LOWER EXTREMITY ARTERIAL DUPLEX  . VAS US ABI WITH/WO TBI   No orders of the defined types were placed in this encounter.   Patient Instructions  Medication Instructions:  Your physician recommends that you continue on your current medications as directed. Please refer to the Current Medication list given to you today.  *If you need a refill on your cardiac medications before your next appointment, please call your pharmacy*   Lab Work: NONE If you have labs (blood work) drawn today and your tests are completely normal, you will receive your results only by: Marland Kitchen. MyChart Message (if you have MyChart) OR . A paper copy in the mail If you  have any lab test that is abnormal or we need to change your treatment, we will call you to review the results.   Testing/Procedures: Your physician has requested that you have a lower  extremity arterial duplex. This test is an ultrasound  of the arteries in the legs or arms. It looks at arterial blood flow in the legs and arms. Allow one hour for Lower and Upper Arterial scans. There are no restrictions or special instructions  Your physician has requested that you have an ankle brachial index (ABI). During this test an ultrasound and blood pressure cuff are used to evaluate the arteries that supply the arms and legs with blood. Allow thirty minutes for this exam. There are no restrictions or special instructions.   Follow-Up: At [facility], you and your health needs are our priority.  As part of our continuing mission to provide you with exceptional heart care, we have created designated Provider Care Teams.  These Care Teams include your primary Cardiologist (physician) and Advanced Practice Providers (APPs -  Physician Assistants and Nurse Practitioners) who all work together to provide you with the care you need, when you need it.  We recommend signing up for the patient portal called "MyChart".  Sign up information is provided on this After Visit Summary.  MyChart is used to connect with patients for Virtual Visits (Telemedicine).  Patients are able to view lab/test results, encounter notes, upcoming appointments, etc.  Non-urgent messages can be sent to your provider as well.   To learn more about what you can do with MyChart, go to [email or web address].    Your next appointment:   6 month(s)  The format for your next appointment:   In Person  Provider:   Thomasene Ripple, DO       Adopting a Healthy Lifestyle.  Know what a healthy weight is for you (roughly BMI <25) and aim to maintain this   Aim for 7+ servings of fruits and vegetables daily   65-80+ fluid ounces of water  or unsweet tea for healthy kidneys   Limit to max 1 drink of alcohol per day; avoid smoking/tobacco   Limit animal fats in diet for cholesterol and heart health - choose grass fed whenever available   Avoid highly processed foods, and foods high in saturated/trans fats   Aim for low stress - take time to unwind and care for your mental health   Aim for 150 min of moderate intensity exercise weekly for heart health, and weights twice weekly for bone health   Aim for 7-9 hours of sleep daily   When it comes to diets, agreement about the perfect plan isnt easy to find, even among the experts. Experts at the [facility] [employer] developed an idea known as the Healthy Eating Plate. Just imagine a plate divided into logical, healthy portions.   The emphasis is on diet quality:   Load up on vegetables and fruits - one-half of your plate: Aim for color and variety, and remember that potatoes dont count.   Go for whole grains - one-quarter of your plate: Whole wheat, barley, wheat berries, quinoa, oats, brown rice, and foods made with them. If you want pasta, go with whole wheat pasta.   Protein power - one-quarter of your plate: Fish, chicken, beans, and nuts are all healthy, versatile protein sources. Limit red meat.   The diet, however, does go beyond the plate, offering a few other suggestions.   Use healthy plant oils, such as olive, canola, soy, corn, sunflower and peanut. Check the labels, and avoid partially hydrogenated oil, which have unhealthy trans fats.   If youre thirsty, drink water. Coffee and tea are good in moderation, but skip sugary drinks and limit milk and dairy  products to one or two daily servings.   The type of carbohydrate in the diet is more important than the amount. Some sources of carbohydrates, such as vegetables, fruits, whole grains, and beans-are healthier than others.   Finally, stay active  Signed, Thomasene Ripple, DO  02/13/2020 11:10 PM      [facility]

## 2020-02-21 ENCOUNTER — Ambulatory Visit: Payer: Managed Care, Other (non HMO) | Admitting: Cardiology

## 2020-03-09 ENCOUNTER — Ambulatory Visit (INDEPENDENT_AMBULATORY_CARE_PROVIDER_SITE_OTHER): Payer: Managed Care, Other (non HMO)

## 2020-03-09 ENCOUNTER — Telehealth: Payer: Self-pay

## 2020-03-09 ENCOUNTER — Other Ambulatory Visit: Payer: Self-pay

## 2020-03-09 DIAGNOSIS — I2510 Atherosclerotic heart disease of native coronary artery without angina pectoris: Secondary | ICD-10-CM | POA: Diagnosis not present

## 2020-03-09 DIAGNOSIS — I739 Peripheral vascular disease, unspecified: Secondary | ICD-10-CM

## 2020-03-09 NOTE — Telephone Encounter (Signed)
-----   Message from Baldo Daub, MD sent at 03/09/2020 12:56 PM EDT ----- Normal or stable result

## 2020-03-09 NOTE — Telephone Encounter (Signed)
Spoke with patient regarding results and recommendation.  Patient verbalizes understanding and is agreeable to plan of care. Advised patient to call back with any issues or concerns.  

## 2020-03-09 NOTE — Progress Notes (Signed)
ABI exam performed.  Jimmy Ry Moody RDCS, RVT  

## 2021-03-31 IMAGING — CT CT HEART MORP W/ CTA COR W/ SCORE W/ CA W/CM &/OR W/O CM
3 of 8 series · 7 of 20 positions shown, 8 images · IV contrast (APPLIED)
Comparison: None.
COMPARISON: None.

Addendum:
EXAM:
OVER-READ INTERPRETATION  CT CHEST

The following report is an over-read performed by radiologist Dr.
Kiri Jim [REDACTED] on 02/04/2020. This over-read
does not include interpretation of cardiac or coronary anatomy or
pathology. The coronary CTA interpretation by the cardiologist is
attached.
CLINICAL DATA: Chest pain
Cardiac CTA
MEDICATIONS:
Sub lingual nitro. 4 mg
TECHNIQUE: The patient was scanned on a Siemens Force 192 scanner. Gantry
rotation speed was 250 msecs. Collimation was. 6 mm . A 120 kV
prospective scan was triggered in the ascending thoracic aorta at
140 HU's with full mA between 30-70% of the R-R interval . Average
HR during the scan was 73 bpm. The 3D data set was interpreted on a
dedicated work station using MPR, MIP and VRT modes. A total of 80
cc of contrast was used.

[Series 7: best syst · axial · 0.39mm/px · z∈[-209,-98]mm · 3 of 278 slices shown, 4 images]
[im 1/278  vessel]
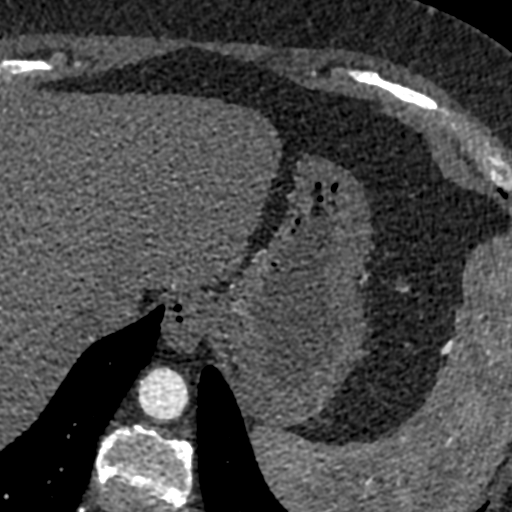
[im 1/278  lung]
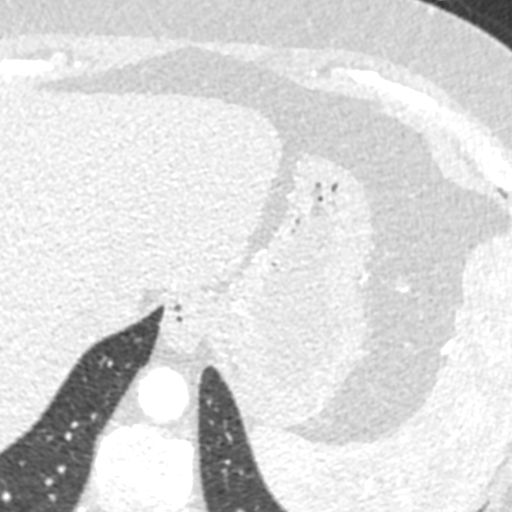
[im 139/278  vessel]
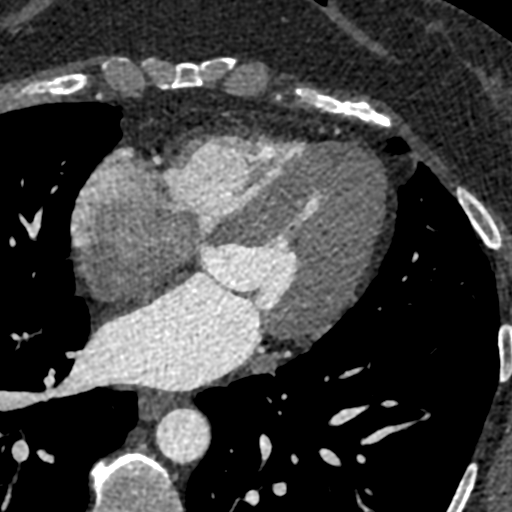
[im 278/278  vessel]
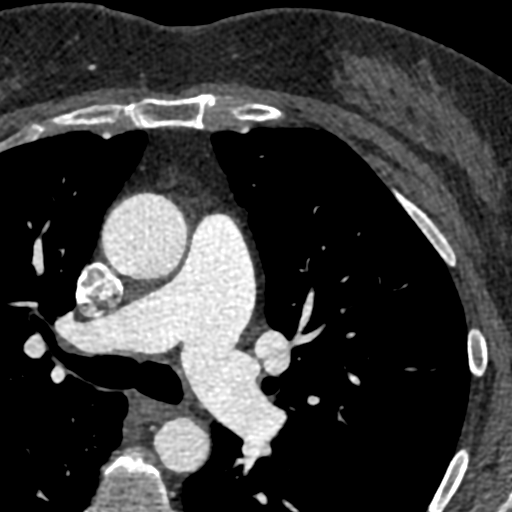

[Series 12: ts diast sharp · axial · 0.39mm/px · z∈[-172,-135]mm · 2 of 278 slices shown]
[im 93/278  lung]
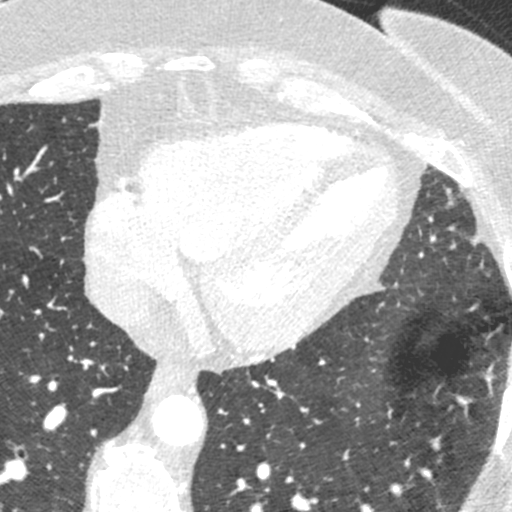
[im 185/278  lung]
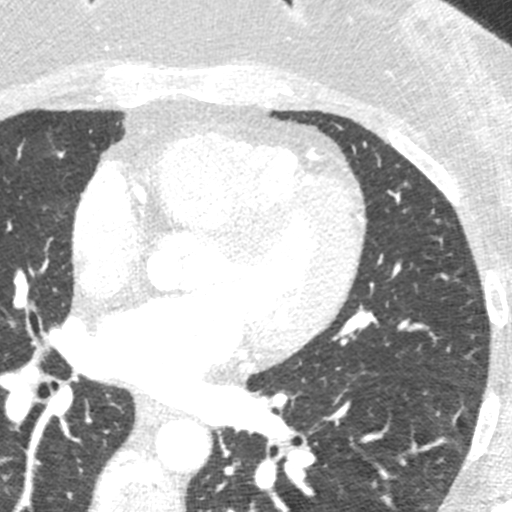

[Series 13: ts syst sharp · axial · 0.39mm/px · z∈[-172,-135]mm · 2 of 278 slices shown]
[im 93/278  lung]
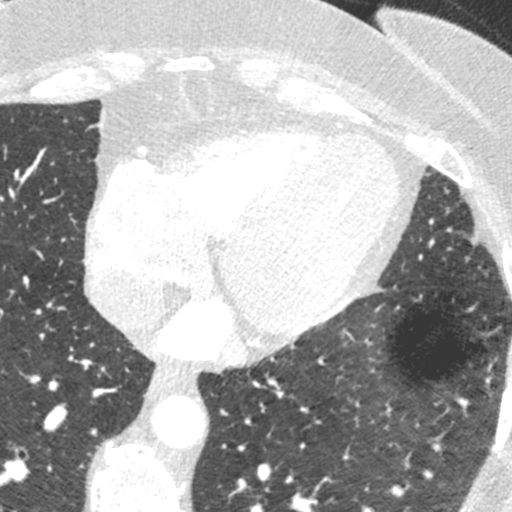
[im 185/278  lung]
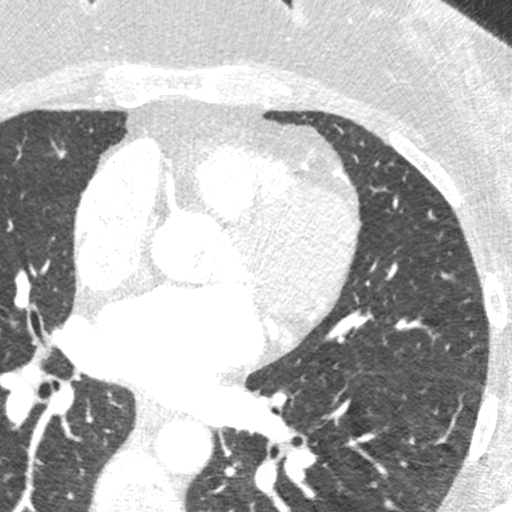

[7 of 20 positions shown; findings below may reference images not displayed]

FINDINGS: Vascular: Heart is normal size.  Aorta normal caliber.

Mediastinum/Nodes: No adenopathy in the lower mediastinum or hila.

Lungs/Pleura: Visualized lungs clear.  No effusions.

Upper Abdomen: Imaging into the upper abdomen shows no acute
findings.

Musculoskeletal: Chest wall soft tissues are unremarkable. No acute
bony abnormality.
IMPRESSION: No acute or significant extracardiac abnormality.
FINDINGS: Non-cardiac: See separate report from [REDACTED]. No
significant findings on limited lung and soft tissue windows.

Calcium score: Mild 3 vessel coronary calcium noted

Coronary Arteries: Right dominant with no anomalies

LM: Normal

LAD: 1-24% calcific stenosis in mid vessel

D1: Normal

D2: Normal

Circumflex: 1-24% calcific plaque in proximal vessel

OM1: Normal

OM2: Normal

RCA: 1-24% calcific plaque in mid vessel

PDA: Normal

PLA: Normal
IMPRESSION: 1. Calcium score 16 this is 83 [REDACTED] percentile for age and sex

2.  Normal aortic root diameter 3.2 cm

3.  CAD RADS 1 non obstructive CAD see description above

Joung Kyu Kamara

*** End of Addendum ***
EXAM:
OVER-READ INTERPRETATION  CT CHEST

The following report is an over-read performed by radiologist Dr.
Kiri Jim [REDACTED] on 02/04/2020. This over-read
does not include interpretation of cardiac or coronary anatomy or
pathology. The coronary CTA interpretation by the cardiologist is
attached.
FINDINGS: Vascular: Heart is normal size.  Aorta normal caliber.

Mediastinum/Nodes: No adenopathy in the lower mediastinum or hila.

Lungs/Pleura: Visualized lungs clear.  No effusions.

Upper Abdomen: Imaging into the upper abdomen shows no acute
findings.

Musculoskeletal: Chest wall soft tissues are unremarkable. No acute
bony abnormality.
IMPRESSION: No acute or significant extracardiac abnormality.

## 2024-02-20 ENCOUNTER — Encounter (HOSPITAL_BASED_OUTPATIENT_CLINIC_OR_DEPARTMENT_OTHER): Payer: Self-pay

## 2024-02-20 ENCOUNTER — Ambulatory Visit (HOSPITAL_BASED_OUTPATIENT_CLINIC_OR_DEPARTMENT_OTHER): Admission: EM | Admit: 2024-02-20 | Discharge: 2024-02-20 | Disposition: A | Discharge status: Home / Self Care

## 2024-02-20 DIAGNOSIS — J069 Acute upper respiratory infection, unspecified: Secondary | ICD-10-CM | POA: Diagnosis not present

## 2024-02-20 NOTE — ED Triage Notes (Signed)
 Onset Friday of cough and sinus congestion.  Productive cough still. States took an expired home covid test and negative. States [employer] don't help. Prescription cough syrup knocks her out. States feeling slightly better. Denies sore throat. States hx of asthma and ears feeling full.

## 2024-02-20 NOTE — Discharge Instructions (Signed)
 I believe this is a virus.  You can continue over-the-counter medicines for symptoms as needed.  Hopefully will run its course and would not be concern for being contagious at this point. Follow up as needed

## 2024-02-22 NOTE — ED Provider Notes (Signed)
 [facility]    CSN: [id number] Arrival date & time: 02/20/24  1734      History   Chief Complaint Chief Complaint  Patient presents with   Cough   Nasal Congestion    HPI Shirley Nash is a 63 y.o. female.   Pt is a 63 year old female presents with cough and sinus congestion.  Productive cough. States took an expired home covid test and negative. States [employer] don't help. States feeling slightly better. Denies sore throat. States hx of asthma and ears feeling full. Low grade fever   Cough   Past Medical History:  Diagnosis Date   Adjustment disorder with depressed mood 11/04/2010   Adult ADHD (attention deficit hyperactivity disorder)    Allergic rhinitis due to pollen 01/02/2008   Asthma 08/22/2006   Degeneration, intervertebral disc, lumbar    Depression    Dumping syndrome 07/10/2012   Dyslipidemia    Gastroesophageal reflux disease    Heart murmur    Hypertension 01/14/2020   Insomnia 01/12/2010   Irritable bowel syndrome    Migraine 01/14/2020   Mild persistent asthma    Obesity, Class III, BMI 40-49.9 (morbid obesity) 06/11/2012   Prediabetes 07/03/2013   Type II diabetes mellitus with complication [facility])     Patient Active Problem List   Diagnosis Date Noted   Mild aortic stenosis 02/13/2020   Shortness of breath 02/13/2020   Claudication of lower extremity (HCC) 02/13/2020   Hypertension 01/14/2020   Migraine 01/14/2020   Dyspnea on exertion 01/14/2020   Bilateral leg edema 01/14/2020   Family history of premature CAD 01/14/2020   Obesity (BMI 30-39.9) 01/14/2020   Heart murmur    Gastroesophageal reflux disease    Type II diabetes mellitus with complication (HCC)    Degeneration, intervertebral disc, lumbar    Mild persistent asthma    Irritable bowel syndrome    Dyslipidemia    Depression    Adult ADHD (attention deficit hyperactivity disorder)    Prediabetes 07/03/2013   Dumping syndrome 07/10/2012   Obesity, Class III, BMI  40-49.9 (morbid obesity) 06/11/2012   Adjustment disorder with depressed mood 11/04/2010   Insomnia 01/12/2010   Allergic rhinitis due to pollen 01/02/2008   Asthma 08/22/2006    Past Surgical History:  Procedure Laterality Date   BLADDER SURGERY     CATARACT EXTRACTION, BILATERAL Bilateral    CHOLECYSTECTOMY, LAPAROSCOPIC     NECK SURGERY     PARTIAL HYSTERECTOMY     TONSILLECTOMY      OB History   No obstetric history on file.      Home Medications    Prior to Admission medications   Medication Sig Start Date End Date Taking? Authorizing Provider  amphetamine-dextroamphetamine (ADDERALL XR) 30 MG 24 hr capsule Take 60 mg by mouth daily.  07/07/18   [provider]  Aspirin Buf,CaCarb-MgCarb-MgO, 81 MG TABS Take by mouth daily.     [provider]  atorvastatin  (LIPITOR) 40 MG tablet Take 1 tablet (40 mg total) by mouth daily. 02/05/20 05/05/20  Walker, Caitlin S, NP  Azelastine-Fluticasone (DYMISTA) 137-50 MCG/ACT SUSP Inhale into the lungs as needed.  07/25/18   [provider]  benazepril (LOTENSIN) 40 MG tablet Take 40 mg by mouth daily. 11/07/19   [provider]  Biotin 1000 MCG tablet Take 1,000 mcg by mouth daily.     [provider]  buPROPion (WELLBUTRIN XL) 150 MG 24 hr tablet Take 450 mg by mouth daily.  11/18/15   [provider]  cyclobenzaprine (FLEXERIL) 5 MG tablet Take by mouth. 04/22/14   [provider]  ergocalciferol (VITAMIN D2) 1.25 MG (50000 UT) capsule Take 50,000 Units by mouth once a week.  04/22/14   [provider]  Fluticasone-Salmeterol (ADVAIR) 100-50 MCG/DOSE AEPB Inhale 1 puff into the lungs 2 (two) times daily.    [provider]  furosemide  (LASIX ) 40 MG tablet Take 1 tablet (40 mg total) by mouth daily. 01/14/20   Tobb, Kardie, DO  hydrochlorothiazide (HYDRODIURIL) 12.5 MG tablet Take 12.5 mg by mouth daily.  01/17/16   [provider]  meclizine (ANTIVERT) 12.5  MG tablet Take 12.5 mg by mouth 3 (three) times daily as needed.    [provider]  metFORMIN (GLUCOPHAGE) 1000 MG tablet Take 1,000 mg by mouth daily. 11/07/19   [provider]  metoprolol  succinate (TOPROL -XL) 50 MG 24 hr tablet Take 50 mg by mouth daily. 12/06/19   [provider]  montelukast (SINGULAIR) 10 MG tablet Take 10 mg by mouth daily. 11/07/19   [provider]  potassium chloride  SA (KLOR-CON ) 20 MEQ tablet Take 1 tablet (20 mEq total) by mouth daily. 01/14/20   Tobb, Kardie, DO    Family History Family History  Problem Relation Age of Onset   Asthma Mother    Stroke Mother    Heart disease Father    Diabetes Father    Hypertension Father    Prostate cancer Father    Skin cancer Father    Asthma Sister    Hypertension Sister    Stroke Maternal Grandmother    Hypertension Maternal Grandmother    Diabetes Paternal Grandmother    Hypertension Paternal Grandmother     Social History Social History   Tobacco Use   Smoking status: Former    Current packs/day: 0.00    Types: Cigarettes    Quit date: 2011    Years since quitting: 14.4   Smokeless tobacco: Never  Substance Use Topics   Alcohol use: Yes   Drug use: Never     Allergies   Patient has no known allergies.   Review of Systems Review of Systems  Respiratory:  Positive for cough.    See HPI  Physical Exam Triage Vital Signs ED Triage Vitals  Encounter Vitals Group     BP 02/20/24 1808 (!) 164/97     Systolic BP Percentile --      Diastolic BP Percentile --      Pulse Rate 02/20/24 1808 (!) 108     Resp 02/20/24 1808 20     Temp 02/20/24 1808 99.1 F (37.3 C)     Temp Source 02/20/24 1808 Oral     SpO2 02/20/24 1808 96 %     Weight --      Height --      Head Circumference --      Peak Flow --      Pain Score 02/20/24 1809 1     Pain Loc --      Pain Education --      Exclude from Growth Chart --    No data found.  Updated Vital Signs BP (!)  164/97 (BP Location: Right Arm)   Pulse (!) 108   Temp 99.1 F (37.3 C) (Oral)   Resp 20   SpO2 96%   Visual Acuity Right Eye Distance:   Left Eye Distance:   Bilateral Distance:    Right Eye Near:  Left Eye Near:    Bilateral Near:     Physical Exam Constitutional:      General: She is not in acute distress.    Appearance: Normal appearance. She is not ill-appearing, toxic-appearing or diaphoretic.  HENT:     Head: Normocephalic and atraumatic.     Right Ear: Tympanic membrane and ear canal normal.     Left Ear: Tympanic membrane and ear canal normal.     Nose: Congestion and rhinorrhea present.     Mouth/Throat:     Pharynx: Oropharynx is clear.  Eyes:     Conjunctiva/sclera: Conjunctivae normal.  Cardiovascular:     Rate and Rhythm: Normal rate and regular rhythm.     Pulses: Normal pulses.     Heart sounds: Normal heart sounds.  Pulmonary:     Effort: Pulmonary effort is normal.     Breath sounds: Normal breath sounds.  Skin:    General: Skin is warm and dry.  Neurological:     Mental Status: She is alert.  Psychiatric:        Mood and Affect: Mood normal.      UC Treatments / Results  Labs (all labs ordered are listed, but only abnormal results are displayed) Labs Reviewed - No data to display  EKG   Radiology No results found.  Procedures Procedures (including critical care time)  Medications Ordered in UC Medications - No data to display  Initial Impression / Assessment and Plan / UC Course  I have reviewed the triage vital signs and the nursing notes.  Pertinent labs & imaging results that were available during my care of the patient were reviewed by me and considered in my medical decision making (see chart for details).     Viral illness-no concerns on exam.  Recommend symptomatic treatment for relief of symptoms. Rest, hydrate and follow-up as needed  Final Clinical Impressions(s) / UC Diagnoses   Final diagnoses:  Viral upper  respiratory tract infection     Discharge Instructions      I believe this is a virus.  You can continue over-the-counter medicines for symptoms as needed.  Hopefully will run its course and would not be concern for being contagious at this point. Follow up as needed   ED Prescriptions   None    PDMP not reviewed this encounter.   Landa Pine, FNP 02/25/24 [phone]

## 2024-07-04 ENCOUNTER — Ambulatory Visit (HOSPITAL_BASED_OUTPATIENT_CLINIC_OR_DEPARTMENT_OTHER)
Admission: EM | Admit: 2024-07-04 | Discharge: 2024-07-04 | Disposition: A | Discharge status: Home / Self Care | Attending: Family Medicine | Admitting: Family Medicine

## 2024-07-04 ENCOUNTER — Encounter (HOSPITAL_BASED_OUTPATIENT_CLINIC_OR_DEPARTMENT_OTHER): Payer: Self-pay

## 2024-07-04 DIAGNOSIS — R051 Acute cough: Secondary | ICD-10-CM | POA: Diagnosis not present

## 2024-07-04 LAB — POC COVID19/FLU A&B COMBO
Covid Antigen, POC: NEGATIVE
Influenza A Antigen, POC: NEGATIVE
Influenza B Antigen, POC: NEGATIVE

## 2024-07-04 MED ORDER — AZITHROMYCIN 250 MG PO TABS: 250.0000 mg | ORAL_TABLET | Freq: Every day | ORAL | 0 refills | Status: AC

## 2024-07-04 MED ORDER — PROMETHAZINE-DM 6.25-15 MG/5ML PO SYRP: 5.0000 mL | ORAL_SOLUTION | Freq: Four times a day (QID) | ORAL | 0 refills | Status: AC | PRN

## 2024-07-04 NOTE — Discharge Instructions (Addendum)
 Your covid and flu test were negative. Treating you for infection since you are getting worse.  I am giving you a Zpac and some cough medication to use as needed. Drink plenty of fluids and follow up as needed.

## 2024-07-04 NOTE — ED Triage Notes (Signed)
 Patient presents to UC for cough, congestion, and fatigue x 5 days. Traveled recently.   Treating symptoms with mucinex. One dose yesterday.   Denies fever.

## 2024-07-08 NOTE — ED Provider Notes (Signed)
 [facility]    CSN: [id number] Arrival date & time: 07/04/24  1945      History   Chief Complaint Chief Complaint  Patient presents with   Cough    HPI Shirley Nash is a 63 y.o. female.   Pt is a 63 year old female that presents to UC for cough, congestion, and fatigue x 5 days. Traveled recently. Treating symptoms with mucinex. One dose yesterday. Denies fever.    Cough   Past Medical History:  Diagnosis Date   Adjustment disorder with depressed mood 11/04/2010   Adult ADHD (attention deficit hyperactivity disorder)    Allergic rhinitis due to pollen 01/02/2008   Asthma 08/22/2006   Degeneration, intervertebral disc, lumbar    Depression    Dumping syndrome 07/10/2012   Dyslipidemia    Gastroesophageal reflux disease    Heart murmur    Hypertension 01/14/2020   Insomnia 01/12/2010   Irritable bowel syndrome    Migraine 01/14/2020   Mild persistent asthma    Obesity, Class III, BMI 40-49.9 (morbid obesity) (HCC) 06/11/2012   Prediabetes 07/03/2013   Type II diabetes mellitus with complication [facility])     Patient Active Problem List   Diagnosis Date Noted   Mild aortic stenosis 02/13/2020   Shortness of breath 02/13/2020   Claudication of lower extremity 02/13/2020   Hypertension 01/14/2020   Migraine 01/14/2020   Dyspnea on exertion 01/14/2020   Bilateral leg edema 01/14/2020   Family history of premature CAD 01/14/2020   Obesity (BMI 30-39.9) 01/14/2020   Heart murmur    Gastroesophageal reflux disease    Type II diabetes mellitus with complication (HCC)    Degeneration, intervertebral disc, lumbar    Mild persistent asthma    Irritable bowel syndrome    Dyslipidemia    Depression    Adult ADHD (attention deficit hyperactivity disorder)    Prediabetes 07/03/2013   Dumping syndrome 07/10/2012   Obesity, Class III, BMI 40-49.9 (morbid obesity) (HCC) 06/11/2012   Adjustment disorder with depressed mood 11/04/2010   Insomnia 01/12/2010    Allergic rhinitis due to pollen 01/02/2008   Asthma 08/22/2006    Past Surgical History:  Procedure Laterality Date   BLADDER SURGERY     CATARACT EXTRACTION, BILATERAL Bilateral    CHOLECYSTECTOMY, LAPAROSCOPIC     NECK SURGERY     PARTIAL HYSTERECTOMY     TONSILLECTOMY      OB History   No obstetric history on file.      Home Medications    Prior to Admission medications   Medication Sig Start Date End Date Taking? Authorizing Provider  azithromycin (ZITHROMAX) 250 MG tablet Take 1 tablet (250 mg total) by mouth daily. Take first 2 tablets together, then 1 every day until finished. 07/04/24  Yes Adelae Yodice A, FNP  promethazine-dextromethorphan (PROMETHAZINE-DM) 6.25-15 MG/5ML syrup Take 5 mLs by mouth 4 (four) times daily as needed for cough. 07/04/24  Yes Jalynn Waddell A, FNP  amphetamine-dextroamphetamine (ADDERALL XR) 30 MG 24 hr capsule Take 60 mg by mouth daily.  07/07/18   [provider]  Aspirin Buf,CaCarb-MgCarb-MgO, 81 MG TABS Take by mouth daily.     [provider]  atorvastatin  (LIPITOR) 40 MG tablet Take 1 tablet (40 mg total) by mouth daily. 02/05/20 05/05/20  Walker, Caitlin S, NP  Azelastine-Fluticasone (DYMISTA) 137-50 MCG/ACT SUSP Inhale into the lungs as needed.  07/25/18   [provider]  benazepril (LOTENSIN) 40 MG tablet Take 40 mg by mouth daily. 11/07/19  [provider]  Biotin 1000 MCG tablet Take 1,000 mcg by mouth daily.     [provider]  buPROPion (WELLBUTRIN XL) 150 MG 24 hr tablet Take 450 mg by mouth daily.  11/18/15   [provider]  cyclobenzaprine (FLEXERIL) 5 MG tablet Take by mouth. 04/22/14   [provider]  ergocalciferol (VITAMIN D2) 1.25 MG (50000 UT) capsule Take 50,000 Units by mouth once a week.  04/22/14   [provider]  Fluticasone-Salmeterol (ADVAIR) 100-50 MCG/DOSE AEPB Inhale 1 puff into the lungs 2 (two) times daily.    [provider]  furosemide   (LASIX ) 40 MG tablet Take 1 tablet (40 mg total) by mouth daily. 01/14/20   Tobb, Kardie, DO  hydrochlorothiazide (HYDRODIURIL) 12.5 MG tablet Take 12.5 mg by mouth daily.  01/17/16   [provider]  meclizine (ANTIVERT) 12.5 MG tablet Take 12.5 mg by mouth 3 (three) times daily as needed.    [provider]  metFORMIN (GLUCOPHAGE) 1000 MG tablet Take 1,000 mg by mouth daily. 11/07/19   [provider]  metoprolol  succinate (TOPROL -XL) 50 MG 24 hr tablet Take 50 mg by mouth daily. 12/06/19   [provider]  montelukast (SINGULAIR) 10 MG tablet Take 10 mg by mouth daily. 11/07/19   [provider]  potassium chloride  SA (KLOR-CON ) 20 MEQ tablet Take 1 tablet (20 mEq total) by mouth daily. 01/14/20   Tobb, Kardie, DO    Family History Family History  Problem Relation Age of Onset   Asthma Mother    Stroke Mother    Heart disease Father    Diabetes Father    Hypertension Father    Prostate cancer Father    Skin cancer Father    Asthma Sister    Hypertension Sister    Stroke Maternal Grandmother    Hypertension Maternal Grandmother    Diabetes Paternal Grandmother    Hypertension Paternal Grandmother     Social History Social History   Tobacco Use   Smoking status: Former    Current packs/day: 0.00    Types: Cigarettes    Quit date: 2011    Years since quitting: 14.8   Smokeless tobacco: Never  Substance Use Topics   Alcohol use: Yes   Drug use: Never     Allergies   Dust mite extract and Molds & smuts   Review of Systems Review of Systems  Respiratory:  Positive for cough.      Physical Exam Triage Vital Signs ED Triage Vitals  Encounter Vitals Group     BP 07/04/24 2000 (!) 161/97     Girls Systolic BP Percentile --      Girls Diastolic BP Percentile --      Boys Systolic BP Percentile --      Boys Diastolic BP Percentile --      Pulse Rate 07/04/24 2000 91     Resp 07/04/24 2000 18     Temp 07/04/24 2000 98.2 F  (36.8 C)     Temp Source 07/04/24 2000 Oral     SpO2 07/04/24 2000 95 %     Weight --      Height --      Head Circumference --      Peak Flow --      Pain Score 07/04/24 1959 0     Pain Loc --      Pain Education --      Exclude from Growth Chart --  No data found.  Updated Vital Signs BP (!) 161/97 (BP Location: Right Arm)   Pulse 91   Temp 98.2 F (36.8 C) (Oral)   Resp 18   SpO2 95%   Visual Acuity Right Eye Distance:   Left Eye Distance:   Bilateral Distance:    Right Eye Near:   Left Eye Near:    Bilateral Near:     Physical Exam Constitutional:      General: She is not in acute distress.    Appearance: Normal appearance. She is not ill-appearing, toxic-appearing or diaphoretic.  HENT:     Head: Normocephalic and atraumatic.     Right Ear: Tympanic membrane and ear canal normal.     Left Ear: Tympanic membrane and ear canal normal.     Nose: Congestion and rhinorrhea present.     Mouth/Throat:     Pharynx: Oropharynx is clear.  Eyes:     Conjunctiva/sclera: Conjunctivae normal.  Cardiovascular:     Rate and Rhythm: Normal rate and regular rhythm.     Pulses: Normal pulses.     Heart sounds: Normal heart sounds.  Pulmonary:     Effort: Pulmonary effort is normal.     Breath sounds: Normal breath sounds.  Skin:    General: Skin is warm and dry.  Neurological:     Mental Status: She is alert.  Psychiatric:        Mood and Affect: Mood normal.      UC Treatments / Results  Labs (all labs ordered are listed, but only abnormal results are displayed) Labs Reviewed  POC COVID19/FLU A&B COMBO - Normal    EKG   Radiology No results found.  Procedures Procedures (including critical care time)  Medications Ordered in UC Medications - No data to display  Initial Impression / Assessment and Plan / UC Course  I have reviewed the triage vital signs and the nursing notes.  Pertinent labs & imaging results that were available during my care of  the patient were reviewed by me and considered in my medical decision making (see chart for details).     Acute cough- worsening after 5 days. Will go ahead and treat with Azithromycin at this time. Promethazine as needed for cough.  Covid and flu test negative. Follow up as needed.  Final Clinical Impressions(s) / UC Diagnoses   Final diagnoses:  Acute cough     Discharge Instructions      Your covid and flu test were negative. Treating you for infection since you are getting worse.  I am giving you a Zpac and some cough medication to use as needed. Drink plenty of fluids and follow up as needed.     ED Prescriptions     Medication Sig Dispense Auth. Provider   azithromycin (ZITHROMAX) 250 MG tablet Take 1 tablet (250 mg total) by mouth daily. Take first 2 tablets together, then 1 every day until finished. 6 tablet Adil Tugwell A, FNP   promethazine-dextromethorphan (PROMETHAZINE-DM) 6.25-15 MG/5ML syrup Take 5 mLs by mouth 4 (four) times daily as needed for cough. 118 mL Adah Corning A, FNP      PDMP not reviewed this encounter.   Adah Corning LABOR, FNP 07/08/24 1348
